# Patient Record
Sex: Female | Born: 1995 | Race: Black or African American | Hispanic: No | Marital: Married | Smoking: Never smoker
Health system: Southern US, Community
[De-identification: ages and names within clinical notes are randomized; demographics above are authoritative.]

---
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
## 2018-11-24 ENCOUNTER — Other Ambulatory Visit: Payer: Self-pay

## 2018-11-24 DIAGNOSIS — Z20822 Contact with and (suspected) exposure to covid-19: Secondary | ICD-10-CM

## 2018-11-26 LAB — NOVEL CORONAVIRUS, NAA: SARS-CoV-2, NAA: NOT DETECTED

## 2019-03-19 ENCOUNTER — Ambulatory Visit: Payer: Self-pay | Attending: Family

## 2019-03-19 DIAGNOSIS — Z23 Encounter for immunization: Secondary | ICD-10-CM

## 2019-03-24 NOTE — Progress Notes (Signed)
   Covid-19 Vaccination Clinic  Name:  Kim Stewart    MRN: [id number] DOB: 07-Dec-1995  03/24/2019  [name] was observed post Covid-19 immunization for 15 minutes without incident. She was provided with Vaccine Information Sheet and instruction to access the V-Safe system.   [name] was instructed to call 911 with any severe reactions post vaccine: Marland Kitchen Difficulty breathing  . Swelling of face and throat  . A fast heartbeat  . A bad rash all over body  . Dizziness and weakness   Immunizations Administered    Name Date Dose VIS Date Route   Moderna COVID-19 Vaccine 03/19/2019 10:27 AM 0.5 mL 12/02/2018 Intramuscular   Manufacturer: Moderna   Lot: [id number]   NDC: 74081-448-18

## 2019-04-21 ENCOUNTER — Ambulatory Visit: Payer: Self-pay | Attending: Family

## 2019-04-21 DIAGNOSIS — Z23 Encounter for immunization: Secondary | ICD-10-CM

## 2019-04-21 NOTE — Progress Notes (Signed)
   Covid-19 Vaccination Clinic  Name:  Kim Stewart    MRN: [id number] DOB: 05/31/1995  04/21/2019  Kim Stewart was observed post Covid-19 immunization for 15 minutes without incident. She was provided with Vaccine Information Sheet and instruction to access the V-Safe system.   Kim Stewart was instructed to call 911 with any severe reactions post vaccine: Marland Kitchen Difficulty breathing  . Swelling of face and throat  . A fast heartbeat  . A bad rash all over body  . Dizziness and weakness   Immunizations Administered    Name Date Dose VIS Date Route   Moderna COVID-19 Vaccine 04/21/2019  2:20 PM 0.5 mL 12/2018 Intramuscular   Manufacturer: Moderna   Lot: [id number]   NDC: 77373-668-15

## 2021-08-22 DIAGNOSIS — N898 Other specified noninflammatory disorders of vagina: Secondary | ICD-10-CM | POA: Diagnosis not present

## 2021-08-22 DIAGNOSIS — Z01419 Encounter for gynecological examination (general) (routine) without abnormal findings: Secondary | ICD-10-CM | POA: Diagnosis not present

## 2021-08-22 DIAGNOSIS — F419 Anxiety disorder, unspecified: Secondary | ICD-10-CM | POA: Diagnosis not present

## 2021-08-22 DIAGNOSIS — B009 Herpesviral infection, unspecified: Secondary | ICD-10-CM | POA: Diagnosis not present

## 2021-08-23 DIAGNOSIS — N898 Other specified noninflammatory disorders of vagina: Secondary | ICD-10-CM | POA: Diagnosis not present

## 2021-08-23 DIAGNOSIS — Z01419 Encounter for gynecological examination (general) (routine) without abnormal findings: Secondary | ICD-10-CM | POA: Diagnosis not present

## 2022-06-08 ENCOUNTER — Telehealth: Payer: Self-pay | Admitting: Family Medicine

## 2022-06-08 DIAGNOSIS — H44002 Unspecified purulent endophthalmitis, left eye: Secondary | ICD-10-CM

## 2022-06-08 MED ORDER — POLYMYXIN B-TRIMETHOPRIM 10000-0.1 UNIT/ML-% OP SOLN: 1.0000 [drp] | Freq: Four times a day (QID) | OPHTHALMIC | 0 refills | Status: AC

## 2022-06-08 NOTE — Progress Notes (Signed)
Virtual Visit Consent   Kim Stewart, you are scheduled for a virtual visit with a [facility] provider today. Just as with appointments in the office, your consent must be obtained to participate. Your consent will be active for this visit and any virtual visit you may have with one of our providers in the next 365 days. If you have a MyChart account, a copy of this consent can be sent to you electronically.  As this is a virtual visit, video technology does not allow for your provider to perform a traditional examination. This may limit your provider's ability to fully assess your condition. If your provider identifies any concerns that need to be evaluated in person or the need to arrange testing (such as labs, EKG, etc.), we will make arrangements to do so. Although advances in technology are sophisticated, we cannot ensure that it will always work on either your end or our end. If the connection with a video visit is poor, the visit may have to be switched to a telephone visit. With either a video or telephone visit, we are not always able to ensure that we have a secure connection.  By engaging in this virtual visit, you consent to the provision of healthcare and authorize for your insurance to be billed (if applicable) for the services provided during this visit. Depending on your insurance coverage, you may receive a charge related to this service.  I need to obtain your verbal consent now. Are you willing to proceed with your visit today? Kim Stewart has provided verbal consent on 06/08/2022 for a virtual visit (video or telephone). Freddy Finner, NP  Date: 06/08/2022 9:04 AM  Virtual Visit via Video Note   I, Freddy Finner, connected with  Rosana Fret  ([id number], Aug 15, 1995) on 06/08/22 at  9:00 AM EDT by a video-enabled telemedicine application and verified that I am speaking with the correct person using two identifiers.  Location: Patient: Virtual Visit Location Patient:  Home Provider: Virtual Visit Location Provider: Home Office   I discussed the limitations of evaluation and management by telemedicine and the availability of in person appointments. The patient expressed understanding and agreed to proceed.    History of Present Illness: Kim Stewart is a 27 y.o. who identifies as a female who was assigned female at birth, and is being seen today for eye infection.  Onset was this morning - mild swelling, mild redness Associated symptoms are gritty sensation Modifying factors are eye drops- medicated dry eye Denies fevers, chills, sore throat, ear pain, no URI S&S    Problems: There are no problems to display for this patient.   Allergies: No Known Allergies Medications:  Current Outpatient Medications:    meloxicam (MOBIC) 7.5 MG tablet, Take 1 tablet (7.5 mg total) by mouth daily., Disp: 30 tablet, Rfl: 0  Observations/Objective: Patient is well-developed, well-nourished in no acute distress.  Resting comfortably  at home.  Head is normocephalic, atraumatic.  No labored breathing.  Speech is clear and coherent with logical content.  Patient is alert and oriented at baseline.  Right eye sclera is red, EOM intact  Assessment and Plan:    1. Eye infection, left  - trimethoprim-polymyxin b (POLYTRIM) ophthalmic solution; Place 1 drop into the right eye in the morning, at noon, in the evening, and at bedtime.  Dispense: 10 mL; Refill: 0  -most likely irritation given the lack of URI and or drainage and limited redness and improvement with OTC drops. -will order RX  if symptoms progress- symptoms reviewed   Reviewed side effects, risks and benefits of medication.    Patient acknowledged agreement and understanding of the plan.   Past Medical, Surgical, Social History, Allergies, and Medications have been Reviewed.    Follow Up Instructions: I discussed the assessment and treatment plan with the patient. The patient was provided an  opportunity to ask questions and all were answered. The patient agreed with the plan and demonstrated an understanding of the instructions.  A copy of instructions were sent to the patient via MyChart unless otherwise noted below.    The patient was advised to call back or seek an in-person evaluation if the symptoms worsen or if the condition fails to improve as anticipated.  Time:  I spent 10 minutes with the patient via telehealth technology discussing the above problems/concerns.    Freddy Finner, NP

## 2022-06-08 NOTE — Patient Instructions (Addendum)
  Rosana Fret, thank you for joining Freddy Finner, NP for today's virtual visit.  While this provider is not your primary care provider (PCP), if your PCP is located in our provider database this encounter information will be shared with them immediately following your visit.   A [facility] MyChart account gives you access to today's visit and all your visits, tests, and labs performed at [facility] " click here if you don't have a [facility] MyChart account or go to [email or web address]  Consent: (Patient) Kim Stewart provided verbal consent for this virtual visit at the beginning of the encounter.  Current Medications:  Current Outpatient Medications:    trimethoprim-polymyxin b (POLYTRIM) ophthalmic solution, Place 1 drop into the right eye in the morning, at noon, in the evening, and at bedtime., Disp: 10 mL, Rfl: 0   meloxicam (MOBIC) 7.5 MG tablet, Take 1 tablet (7.5 mg total) by mouth daily., Disp: 30 tablet, Rfl: 0   Medications ordered in this encounter:  Meds ordered this encounter  Medications   trimethoprim-polymyxin b (POLYTRIM) ophthalmic solution    Sig: Place 1 drop into the right eye in the morning, at noon, in the evening, and at bedtime.    Dispense:  10 mL    Refill:  0    Order Specific Question:   Supervising Provider    Answer:   Merrilee Jansky [[id number]]     *If you need refills on other medications prior to your next appointment, please contact your pharmacy*  Follow-Up: Call back or seek an in-person evaluation if the symptoms worsen or if the condition fails to improve as anticipated.  [facility] [phone]  Other Instructions  Use the drops you have unless you develop drainage and crusting then start Rx drops.    If you have been instructed to have an in-person evaluation today at a local Urgent Care facility, please use the link below. It will take you to a list of all of our available [facility], including address, phone number and hours of operation. Please do not delay care.  [facility]  If you or a family member do not have a primary care provider, use the link below to schedule a visit and establish care. When you choose a [facility] primary care physician or advanced practice provider, you gain a long-term partner in health. Find a Primary Care Provider  Learn more about [facility]'s in-office and virtual care options: [place] - Get Care Now
